# Patient Record
Sex: Female | Born: 1995 | Race: White | Hispanic: No | Marital: Single | Smoking: Never smoker
Health system: Southern US, Community
[De-identification: ages and names within clinical notes are randomized; demographics above are authoritative.]

---

## 2016-06-18 ENCOUNTER — Emergency Department: Payer: PRIVATE HEALTH INSURANCE

## 2016-06-18 ENCOUNTER — Encounter: Payer: Self-pay | Admitting: Emergency Medicine

## 2016-06-18 ENCOUNTER — Emergency Department
Admission: EM | Admit: 2016-06-18 | Discharge: 2016-06-18 | Disposition: A | Discharge status: Home / Self Care | Payer: PRIVATE HEALTH INSURANCE | Attending: Emergency Medicine | Admitting: Emergency Medicine

## 2016-06-18 DIAGNOSIS — W010XXA Fall on same level from slipping, tripping and stumbling without subsequent striking against object, initial encounter: Secondary | ICD-10-CM | POA: Insufficient documentation

## 2016-06-18 DIAGNOSIS — S86811A Strain of other muscle(s) and tendon(s) at lower leg level, right leg, initial encounter: Secondary | ICD-10-CM | POA: Diagnosis not present

## 2016-06-18 DIAGNOSIS — Y9301 Activity, walking, marching and hiking: Secondary | ICD-10-CM | POA: Insufficient documentation

## 2016-06-18 DIAGNOSIS — Y999 Unspecified external cause status: Secondary | ICD-10-CM | POA: Diagnosis not present

## 2016-06-18 DIAGNOSIS — S86911A Strain of unspecified muscle(s) and tendon(s) at lower leg level, right leg, initial encounter: Secondary | ICD-10-CM

## 2016-06-18 DIAGNOSIS — Y929 Unspecified place or not applicable: Secondary | ICD-10-CM | POA: Insufficient documentation

## 2016-06-18 DIAGNOSIS — S8991XA Unspecified injury of right lower leg, initial encounter: Secondary | ICD-10-CM | POA: Diagnosis present

## 2016-06-18 NOTE — Discharge Instructions (Signed)
You have a sprain to the knee, likely the medial collateral ligament. You should wear the ace bandage and knee immobilizer as needed for support. Apply ice to reduce swelling. Take ibuprofen as needed for pain and swelling. Follow-up with Dr. Hyacinth MeekerMiller as needed for ongoing symptoms.

## 2016-06-18 NOTE — ED Provider Notes (Signed)
[facility] Emergency Department Provider Note ____________________________________________  Time seen: 1634  I have reviewed the triage vital signs and the nursing notes.  HISTORY  Chief Complaint  Knee Pain  HPI Jennifer Acevedo is a 20 y.o. female presents to the ED for evaluation of right knee pain. She describes a history of spontaneous patellar dislocation on the uninvolved, left knee. Her injury occurred last night while walking in some high heels [place]. She apparently tripped over her own feet, and straining the right knee at this time. She feels like the right dislocated at the time of the accident. She fell secondary to the injury, and treated herself with ice and ibuprofen last night. She woke up this morning with increased swelling, pain, and disability to the right knee. She localizes pain to the area under the kneecap at this time. She also reports stiffness with range of motion. She denies any other significant injury at this time she reports no prior history of right knee orthopedic problems.  History reviewed. No pertinent past medical history.  There are no active problems to display for this patient.  History reviewed. No pertinent surgical history.  Prior to Admission medications   Not on File    Allergies Patient has no known allergies.  No family history on file.  Social History Social History  Substance Use Topics  . Smoking status: Never Smoker  . Smokeless tobacco: Never Used  . Alcohol use Yes   Review of Systems  Constitutional: Negative for fever. Cardiovascular: Negative for chest pain. Respiratory: Negative for shortness of breath. Musculoskeletal: Negative for back pain. Right knee pain as above.  Skin: Negative for rash. Neurological: Negative for headaches, focal weakness or numbness. ____________________________________________  PHYSICAL EXAM:  VITAL SIGNS: ED Triage Vitals  Enc Vitals Group     BP  06/18/16 1609 118/79     Pulse Rate 06/18/16 1609 (!) 109     Resp 06/18/16 1609 20     Temp 06/18/16 1609 98.1 F (36.7 C)     Temp Source 06/18/16 1609 Oral     SpO2 06/18/16 1609 96 %     Weight 06/18/16 1609 135 lb (61.2 kg)     Height 06/18/16 1609 5\' 4"  (1.626 m)     Head Circumference --      Peak Flow --      Pain Score 06/18/16 1610 4     Pain Loc --      Pain Edu? --      Excl. in GC? --    Constitutional: Alert and oriented. Well appearing and in no distress. Head: Normocephalic and atraumatic. Cardiovascular: Normal distal pulses. Musculoskeletal: Right knee with base on joint effusion noted. No obvious deformity, dislocation is appreciated. Patient with some minimal patella ballottement on exam. She is tender to palpation to the medial aspect of the joint and has some medial tenderness with varus joint stress. Negative anterior/posterior drawer sign. Negative Lockman and negative McMurray's. No popliteal space fullness or tenderness is appreciated. No distal calf or Achilles tenderness is noted. Nontender with normal range of motion in all extremities.  Neurologic:  Antalgic gait without ataxia. Normal speech and language. No gross focal neurologic deficits are appreciated. Skin:  Skin is warm, dry and intact. No rash noted. ____________________________________________   RADIOLOGY  Right Knee IMPRESSION: Negative.   ____________________________________________  PROCEDURES  Knee immobilizer Ace wrap ____________________________________________  INITIAL IMPRESSION / ASSESSMENT AND PLAN / ED COURSE  Patient with clinical presentation  consistent with a right knee strain and minor effusion. She has [facility] No other internal derangement is suspected. She will be fitted with an Ace wrap and knee immobilizer for support. She will follow with Dr. Hyacinth MeekerMiller for ongoing evaluation and management.  Clinical Course     ____________________________________________  FINAL CLINICAL IMPRESSION(S) / ED DIAGNOSES  Final diagnoses:  Strain of right knee, initial encounter     Lissa HoardJenise V Bacon Kinser Fellman, PA-C 06/18/16 1833    Sharman CheekPhillip Stafford, MD 06/18/16 [phone]

## 2016-06-18 NOTE — ED Triage Notes (Signed)
Pt with right knee pain,

## 2018-01-12 IMAGING — DX DG KNEE COMPLETE 4+V*R*
4 series · 4 of 4 positions shown · non-contrast
Comparison: None.

CLINICAL DATA: Bruising and swelling of the right knee.

EXAM:
RIGHT KNEE - COMPLETE 4+ VIEW

[knee ap]
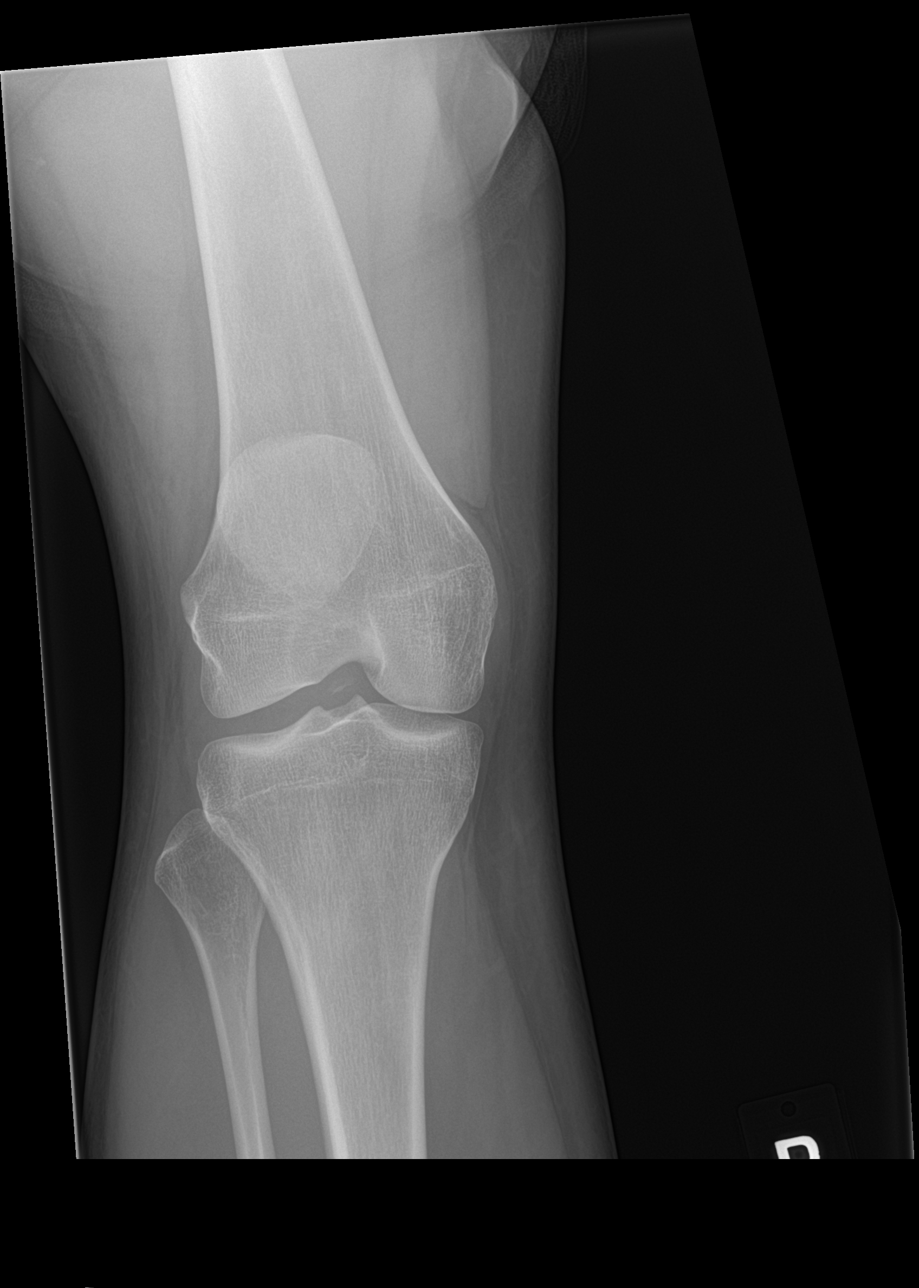

[knee tunnel]
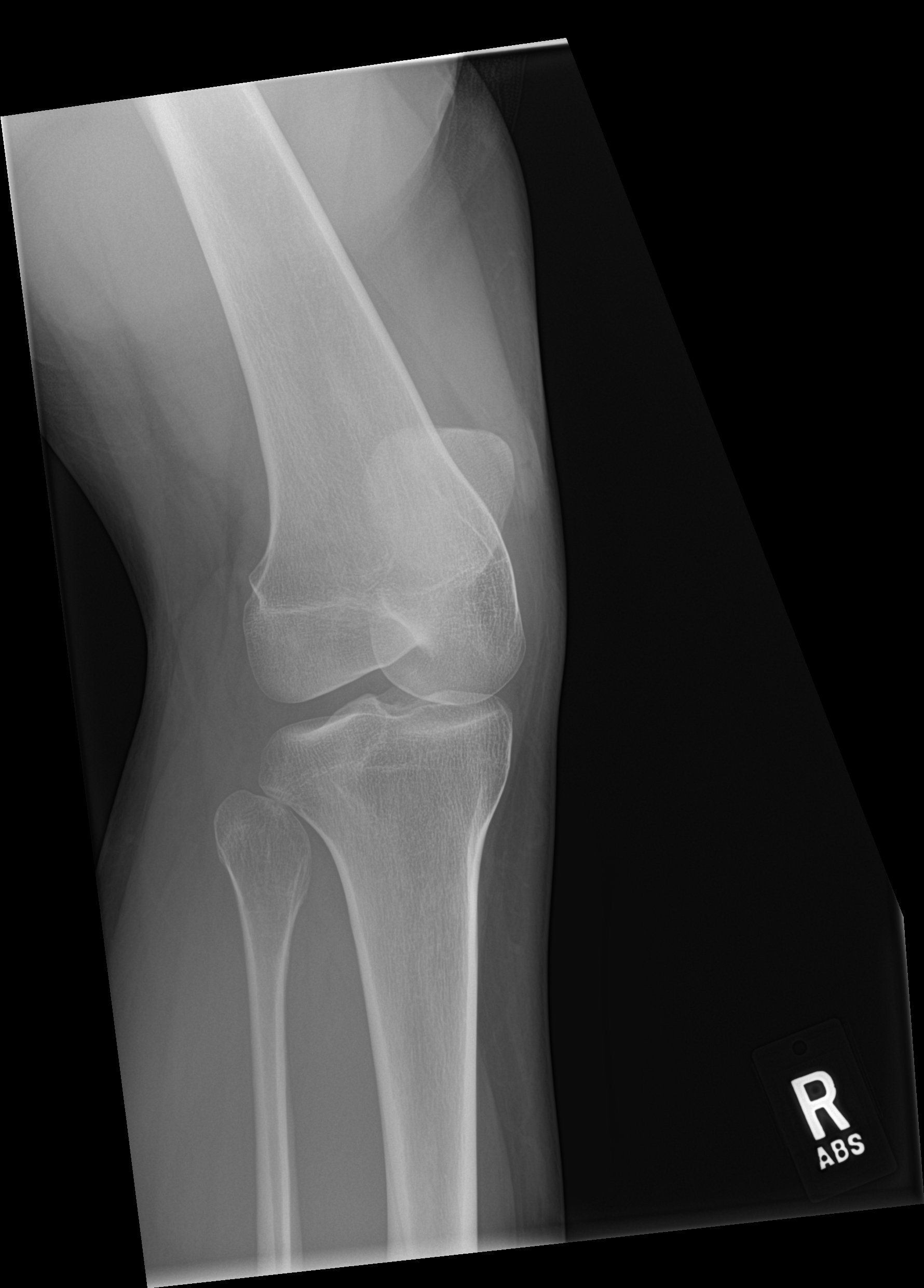

[knee lat]
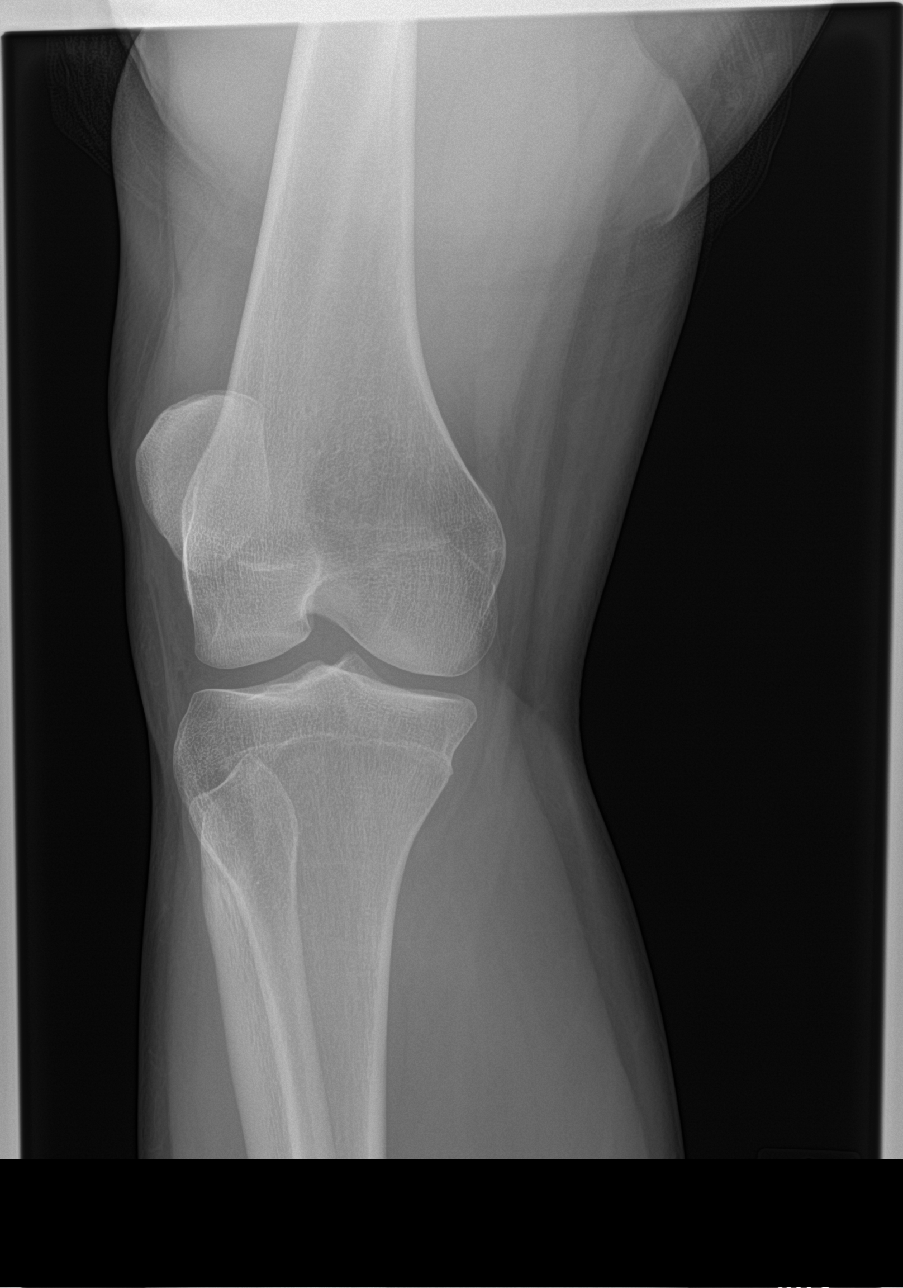

[patella skyline]
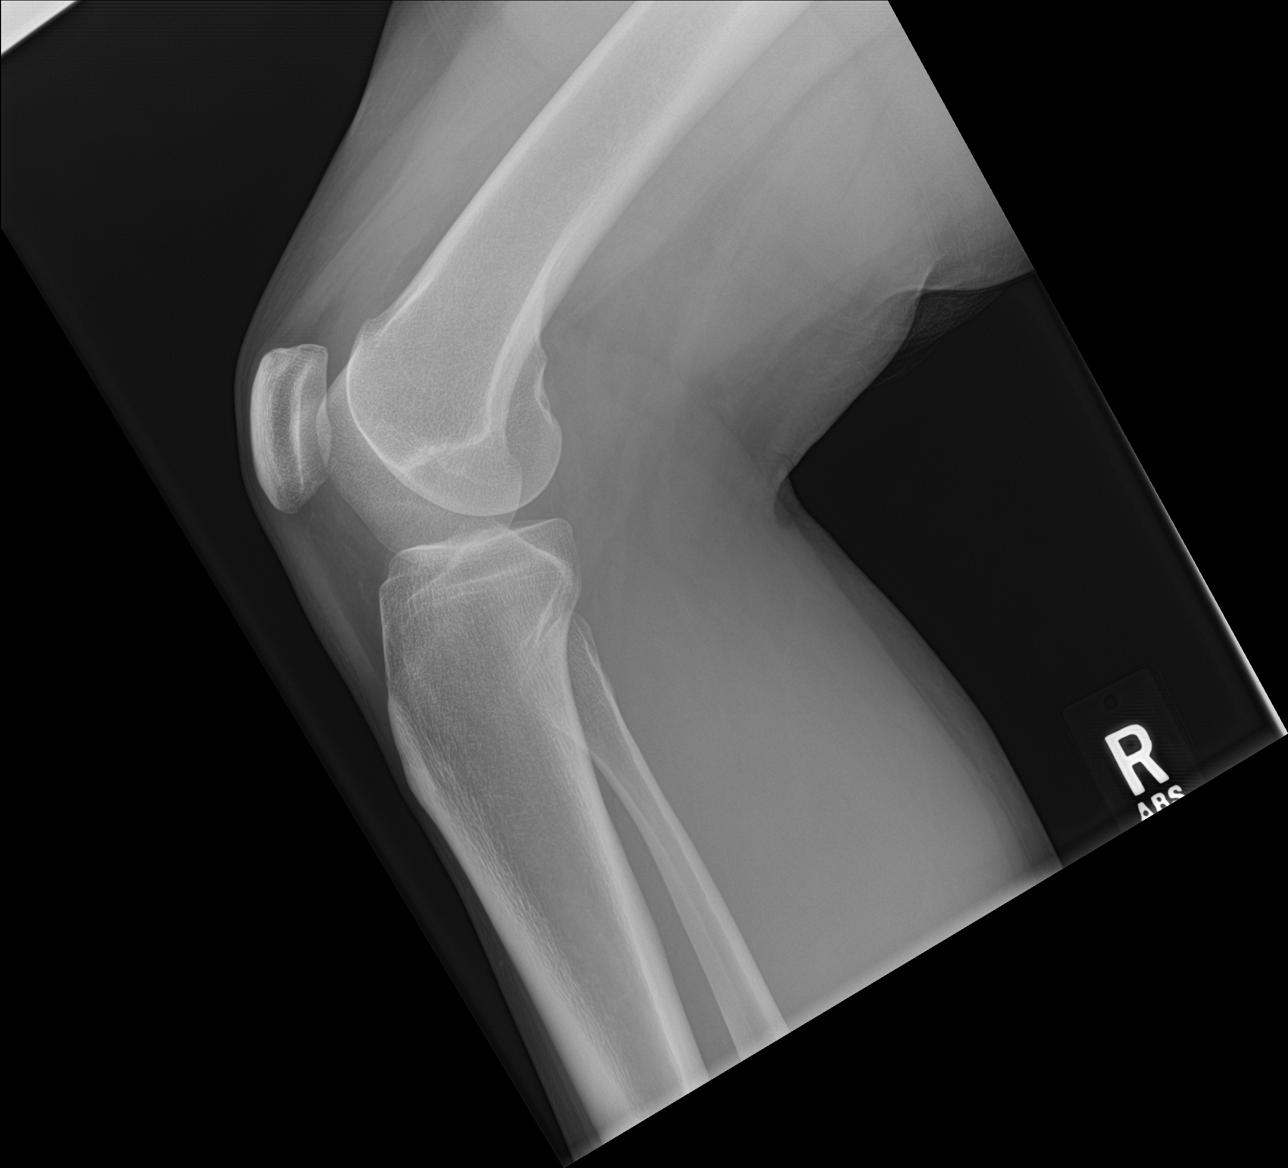

[4 of 4 positions shown; findings below may reference images not displayed]

FINDINGS: No evidence of fracture, dislocation, or joint effusion. No evidence
of arthropathy or other focal bone abnormality. Soft tissues are
unremarkable.
IMPRESSION: Negative.
